# Patient Record
Sex: Female | Born: 2005 | Race: Black or African American | Hispanic: No | Marital: Single | Smoking: Never smoker
Health system: Southern US, Community
[De-identification: ages and names within clinical notes are randomized; demographics above are authoritative.]

---
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
## 2023-02-20 ENCOUNTER — Ambulatory Visit (HOSPITAL_COMMUNITY)
Admission: EM | Admit: 2023-02-20 | Discharge: 2023-02-20 | Disposition: A | Discharge status: Home / Self Care | Payer: Medicaid Other | Attending: Emergency Medicine | Admitting: Emergency Medicine

## 2023-02-20 ENCOUNTER — Encounter (HOSPITAL_COMMUNITY): Payer: Self-pay | Admitting: Emergency Medicine

## 2023-02-20 DIAGNOSIS — R21 Rash and other nonspecific skin eruption: Secondary | ICD-10-CM

## 2023-02-20 MED ORDER — DIPHENHYDRAMINE HCL 25 MG PO TABS: 25.0000 mg | ORAL_TABLET | Freq: Four times a day (QID) | ORAL | 0 refills | Status: AC | PRN

## 2023-02-20 MED ORDER — TRIAMCINOLONE ACETONIDE 0.1 % EX CREA: 1.0000 | TOPICAL_CREAM | Freq: Two times a day (BID) | CUTANEOUS | 0 refills | Status: AC

## 2023-02-20 NOTE — Discharge Instructions (Addendum)
Use the triamcinolone cream to the rash twice daily.  Do not use this for longer than 7 days in a row because it can cause skin thinning.  Try to minimize how often you are washing your hands and applying gloves.  Use unscented soaps and unscented lotions, you can mix the unscented lotion with the triamcinolone cream to provide moisture as well as anti-inflammatory effect.  Use the Benadryl every 6 hours as needed for any breakthrough itching.  This may cause drowsiness or sedation.  Your symptoms should improve over the next 5 days or so, if no improvement please follow-up with your primary care provider for further evaluation.

## 2023-02-20 NOTE — ED Triage Notes (Signed)
Patient states she uses latex gloves at work and noticed she has a rash on her right hand and feels a burning sensation.  She states she tried Aquaphor but it made the rash worse.

## 2023-02-20 NOTE — ED Provider Notes (Signed)
[facility]    CSN: [id number] Arrival date & time: 02/20/23  1847      History   Chief Complaint Chief Complaint  Patient presents with   Rash    HPI Leslie Burgess is a 18 y.o. female.   Patient presents to clinic complaining of an itchy rash to the dorsal aspect of her right hand.  She wears latex gloves at work and noticed that this has been aggravating her hand.  Does not have any history of eczema.  Did try a topical hydrocortisone cream once which helped and then the rash returned.  She works at [employer] and is frequently washing her ankle hands and applying gloves.  Noticed that when she put Aquaphor on it the area burned.  She does itch the rash at night when she tries to sleep.  Area localized to the dorsal aspect of her right hand, has not spread or worsened.  Has been working at basketball for a few years now and has never had issue with the latex gloves in the past.  The history is provided by the patient and medical records.  Rash   History reviewed. No pertinent past medical history.  There are no active problems to display for this patient.   History reviewed. No pertinent surgical history.  OB History   No obstetric history on file.      Home Medications    Prior to Admission medications   Medication Sig Start Date End Date Taking? Authorizing Provider  cetirizine HCl (ZYRTEC) 5 MG/5ML SYRP Take 10 mg by mouth daily.   Yes [provider]  diphenhydrAMINE (BENADRYL) 25 MG tablet Take 1 tablet (25 mg total) by mouth every 6 (six) hours as needed. 02/20/23  Yes Rinaldo Ratel, [place], FNP  ibuprofen (ADVIL,MOTRIN) 600 MG tablet Take 1 tab PO Q6h x 1-2 days then Q6h prn pain 07/01/15  Yes Brewer, Hali Marry, NP  triamcinolone cream (KENALOG) 0.1 % Apply 1 Application topically 2 (two) times daily. 02/20/23  Yes Rinaldo Ratel, [place], FNP  acetaminophen (TYLENOL) 500 MG tablet Take 2 tablets (1,000 mg total) by mouth every 6 (six) hours as needed for  moderate pain. 05/04/21   Jimmy Footman, MD    Family History No family history on file.  Social History Social History   Tobacco Use   Smoking status: Never  Substance Use Topics   Alcohol use: Never   Drug use: Never     Allergies   Patient has no known allergies.   Review of Systems Review of Systems  Per HPI  Physical Exam Triage Vital Signs ED Triage Vitals  Encounter Vitals Group     BP 02/20/23 1945 128/85     Systolic BP Percentile --      Diastolic BP Percentile --      Pulse Rate 02/20/23 1945 75     Resp 02/20/23 1945 18     Temp 02/20/23 1945 98.2 F (36.8 C)     Temp Source 02/20/23 1945 Oral     SpO2 02/20/23 1945 98 %     Weight --      Height --      Head Circumference --      Peak Flow --      Pain Score 02/20/23 1946 0     Pain Loc --      Pain Education --      Exclude from Growth Chart --    No data found.  Updated Vital Signs BP  128/85 (BP Location: Left Arm)   Pulse 75   Temp 98.2 F (36.8 C) (Oral)   Resp 18   LMP 02/17/2023 (Approximate)   SpO2 98%   Visual Acuity Right Eye Distance:   Left Eye Distance:   Bilateral Distance:    Right Eye Near:   Left Eye Near:    Bilateral Near:     Physical Exam Vitals and nursing note reviewed.  Constitutional:      Appearance: Normal appearance.  HENT:     Head: Normocephalic and atraumatic.     Right Ear: External ear normal.     Left Ear: External ear normal.     Nose: Nose normal.     Mouth/Throat:     Mouth: Mucous membranes are moist.  Eyes:     Conjunctiva/sclera: Conjunctivae normal.  Cardiovascular:     Rate and Rhythm: Normal rate.  Pulmonary:     Effort: Pulmonary effort is normal. No respiratory distress.  Musculoskeletal:        General: Normal range of motion.  Skin:    General: Skin is warm and dry.     Findings: Rash present. Rash is urticarial.       Neurological:     General: No focal deficit present.     Mental Status: She is alert and oriented to  person, place, and time.  Psychiatric:        Mood and Affect: Mood normal.        Behavior: Behavior normal.      UC Treatments / Results  Labs (all labs ordered are listed, but only abnormal results are displayed) Labs Reviewed - No data to display  EKG   Radiology No results found.  Procedures Procedures (including critical care time)  Medications Ordered in UC Medications - No data to display  Initial Impression / Assessment and Plan / UC Course  I have reviewed the triage vital signs and the nursing notes.  Pertinent labs & imaging results that were available during my care of the patient were reviewed by me and considered in my medical decision making (see chart for details).  Vitals and triage reviewed, patient is hemodynamically stable.  Urticarial dry scaling rash to the dorsal aspect of the right hand present on physical exam with excoriation marks.  Does not extend to digits or in-between fingers. Will send in triamcinolone cream and encouraged hypoallergenic skin care.  Localized to the hand, will defer systemic steroids at this time.  Plan of care, follow-up care and return precautions given, no questions at this time.     Final Clinical Impressions(s) / UC Diagnoses   Final diagnoses:  Rash and nonspecific skin eruption     Discharge Instructions      Use the triamcinolone cream to the rash twice daily.  Do not use this for longer than 7 days in a row because it can cause skin thinning.  Try to minimize how often you are washing your hands and applying gloves.  Use unscented soaps and unscented lotions, you can mix the unscented lotion with the triamcinolone cream to provide moisture as well as anti-inflammatory effect.  Use the Benadryl every 6 hours as needed for any breakthrough itching.  This may cause drowsiness or sedation.  Your symptoms should improve over the next 5 days or so, if no improvement please follow-up with your primary care provider  for further evaluation.    ED Prescriptions     Medication Sig Dispense Auth. Provider  triamcinolone cream (KENALOG) 0.1 % Apply 1 Application topically 2 (two) times daily. 45 g Rhyland Hinderliter, [place], [place]   diphenhydrAMINE (BENADRYL) 25 MG tablet Take 1 tablet (25 mg total) by mouth every 6 (six) hours as needed. 30 tablet Federick Levene, [place], [place]      PDMP not reviewed this encounter.   Kayliee Atienza, [place], [place] 02/20/23 2006

## 2024-02-29 ENCOUNTER — Encounter (HOSPITAL_BASED_OUTPATIENT_CLINIC_OR_DEPARTMENT_OTHER): Payer: Self-pay

## 2024-02-29 ENCOUNTER — Other Ambulatory Visit: Payer: Self-pay

## 2024-02-29 ENCOUNTER — Emergency Department (HOSPITAL_BASED_OUTPATIENT_CLINIC_OR_DEPARTMENT_OTHER): Admission: EM | Admit: 2024-02-29 | Discharge: 2024-02-29 | Discharge status: Against Medical Advice

## 2024-02-29 DIAGNOSIS — G43909 Migraine, unspecified, not intractable, without status migrainosus: Secondary | ICD-10-CM | POA: Insufficient documentation

## 2024-02-29 DIAGNOSIS — Z5321 Procedure and treatment not carried out due to patient leaving prior to being seen by health care provider: Secondary | ICD-10-CM | POA: Insufficient documentation

## 2024-02-29 LAB — SARS CORONAVIRUS 2 BY RT PCR: SARS Coronavirus 2 by RT PCR: NEGATIVE

## 2024-02-29 NOTE — ED Triage Notes (Signed)
 Pt POV with boyfriend d/t migraine since Friday with dizziness and feeling weak.

## 2024-02-29 NOTE — ED Notes (Signed)
 Pt called 3x to be roomed - no answer
